# Patient Record
Sex: Male | Born: 1960 | Race: White | Hispanic: No
Health system: Southern US, Community
[De-identification: ages and names within clinical notes are randomized; demographics above are authoritative.]

## PROBLEM LIST (undated history)

## (undated) DIAGNOSIS — F515 Nightmare disorder: Secondary | ICD-10-CM

## (undated) DIAGNOSIS — K648 Other hemorrhoids: Secondary | ICD-10-CM

## (undated) DIAGNOSIS — D649 Anemia, unspecified: Secondary | ICD-10-CM

## (undated) DIAGNOSIS — E349 Endocrine disorder, unspecified: Secondary | ICD-10-CM

## (undated) DIAGNOSIS — M549 Dorsalgia, unspecified: Secondary | ICD-10-CM

## (undated) DIAGNOSIS — F4310 Post-traumatic stress disorder, unspecified: Secondary | ICD-10-CM

## (undated) DIAGNOSIS — W5501XA Bitten by cat, initial encounter: Secondary | ICD-10-CM

## (undated) DIAGNOSIS — E663 Overweight: Secondary | ICD-10-CM

## (undated) DIAGNOSIS — I10 Essential (primary) hypertension: Secondary | ICD-10-CM

## (undated) DIAGNOSIS — T7840XA Allergy, unspecified, initial encounter: Secondary | ICD-10-CM

## (undated) DIAGNOSIS — N3281 Overactive bladder: Secondary | ICD-10-CM

## (undated) DIAGNOSIS — G510 Bell's palsy: Secondary | ICD-10-CM

## (undated) DIAGNOSIS — B001 Herpesviral vesicular dermatitis: Secondary | ICD-10-CM

## (undated) DIAGNOSIS — C439 Malignant melanoma of skin, unspecified: Secondary | ICD-10-CM

## (undated) DIAGNOSIS — F4312 Post-traumatic stress disorder, chronic: Secondary | ICD-10-CM

## (undated) DIAGNOSIS — S01459A Open bite of unspecified cheek and temporomandibular area, initial encounter: Secondary | ICD-10-CM

## (undated) DIAGNOSIS — E039 Hypothyroidism, unspecified: Secondary | ICD-10-CM

## (undated) DIAGNOSIS — E785 Hyperlipidemia, unspecified: Secondary | ICD-10-CM

## (undated) HISTORY — DX: Anemia, unspecified: D64.9

## (undated) HISTORY — DX: Post-traumatic stress disorder, chronic: F43.12

## (undated) HISTORY — DX: Dorsalgia, unspecified: M54.9

## (undated) HISTORY — PX: COLONOSCOPY W/ POLYPECTOMY: SHX1380

## (undated) HISTORY — DX: Endocrine disorder, unspecified: E34.9

## (undated) HISTORY — PX: LIPOMA EXCISION: SHX5283

## (undated) HISTORY — DX: Hypothyroidism, unspecified: E03.9

## (undated) HISTORY — PX: HEMORRHOID SURGERY: SHX153

## (undated) HISTORY — DX: Nightmare disorder: F51.5

## (undated) HISTORY — DX: Overweight: E66.3

## (undated) HISTORY — PX: MELANOMA EXCISION: SHX5266

## (undated) HISTORY — PX: TONSILLECTOMY AND ADENOIDECTOMY: SHX28

## (undated) HISTORY — PX: SKIN LESION EXCISION: SHX2412

## (undated) HISTORY — DX: Malignant melanoma of skin, unspecified: C43.9

## (undated) HISTORY — DX: Overactive bladder: N32.81

## (undated) HISTORY — DX: Herpesviral vesicular dermatitis: B00.1

## (undated) HISTORY — DX: Hyperlipidemia, unspecified: E78.5

## (undated) HISTORY — DX: Post-traumatic stress disorder, unspecified: F43.10

## (undated) HISTORY — DX: Bitten by cat, initial encounter: W55.01XA

## (undated) HISTORY — DX: Other hemorrhoids: K64.8

## (undated) HISTORY — DX: Allergy, unspecified, initial encounter: T78.40XA

## (undated) HISTORY — DX: Essential (primary) hypertension: I10

## (undated) HISTORY — DX: Open bite of unspecified cheek and temporomandibular area, initial encounter: S01.459A

## (undated) HISTORY — PX: CERVICAL SPINE SURGERY: SHX589

## (undated) HISTORY — DX: Bell's palsy: G51.0

---

## 2009-01-24 ENCOUNTER — Observation Stay (HOSPITAL_COMMUNITY): Admission: RE | Admit: 2009-01-24 | Discharge: 2009-01-25 | Payer: Self-pay | Admitting: Neurosurgery

## 2009-03-28 LAB — HM COLONOSCOPY

## 2010-03-30 IMAGING — RF DG CERVICAL SPINE 1V
1 series · 1 of 1 positions shown · non-contrast
Comparison: None available.

CLINICAL DATA: 47-year-old male undergoing cervical surgery.

CERVICAL SPINE - 1 VIEW

[Series 1: run · 1 of 1 slices shown]
[im 1/1]
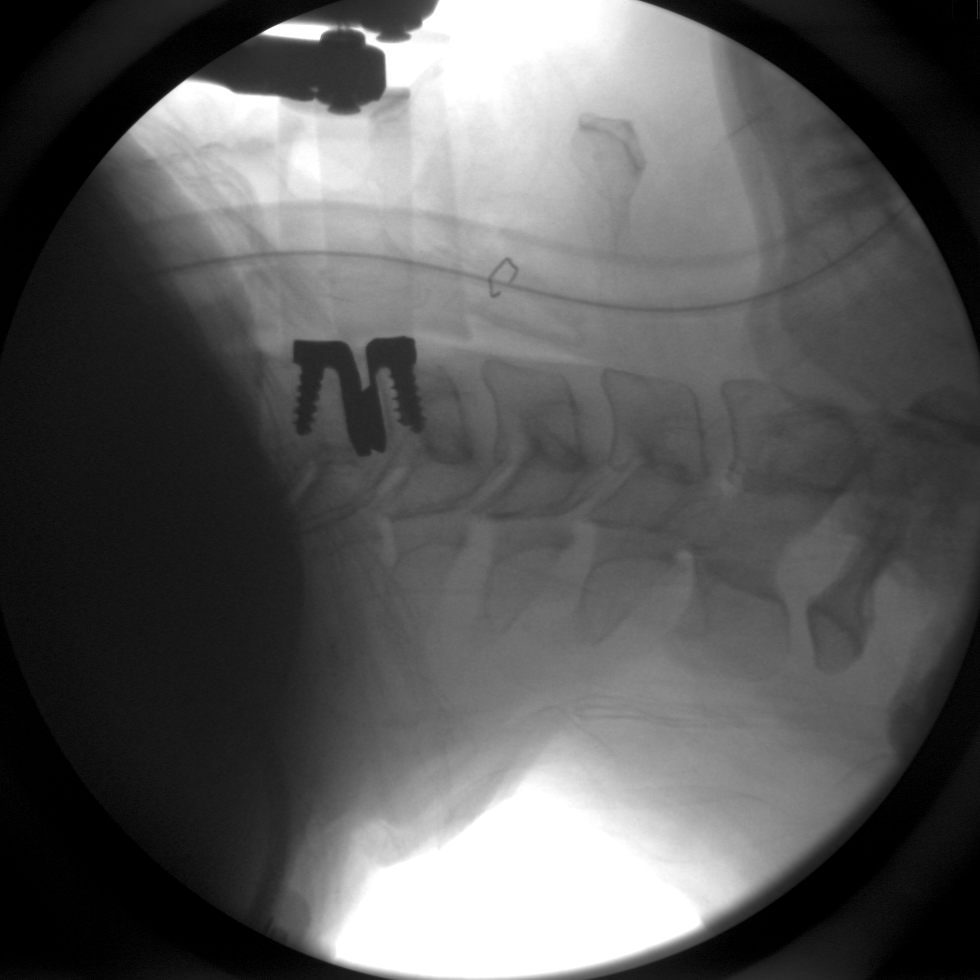

[1 of 1 positions shown; findings below may reference images not displayed]

FINDINGS: Single intraoperative cross-table lateral fluoroscopic
spot view of the cervical spine at 5931 hours.  Disc replacement at
C5-C6 with anchoring hardware at both levels.  No adverse hardware
features identified.  Only the superior aspect of C7 is visible.
IMPRESSION: C5-C6 disc replacement device.  No adverse hardware features
identified.

## 2011-02-07 LAB — URINALYSIS, ROUTINE W REFLEX MICROSCOPIC
Bilirubin Urine: NEGATIVE
Nitrite: NEGATIVE
Specific Gravity, Urine: 1.009 (ref 1.005–1.030)
Urobilinogen, UA: 0.2 mg/dL (ref 0.0–1.0)
pH: 7 (ref 5.0–8.0)

## 2011-02-07 LAB — CBC
HCT: 42.5 % (ref 39.0–52.0)
Hemoglobin: 14.6 g/dL (ref 13.0–17.0)
MCHC: 34.3 g/dL (ref 30.0–36.0)
MCV: 100.2 fL — ABNORMAL HIGH (ref 78.0–100.0)
RBC: 4.24 MIL/uL (ref 4.22–5.81)
WBC: 5.1 10*3/uL (ref 4.0–10.5)

## 2011-02-07 LAB — DIFFERENTIAL
Basophils Absolute: 0 10*3/uL (ref 0.0–0.1)
Eosinophils Absolute: 0.1 10*3/uL (ref 0.0–0.7)
Eosinophils Relative: 2 % (ref 0–5)
Lymphocytes Relative: 25 % (ref 12–46)
Neutrophils Relative %: 67 % (ref 43–77)

## 2011-02-07 LAB — COMPREHENSIVE METABOLIC PANEL
AST: 37 U/L (ref 0–37)
BUN: 7 mg/dL (ref 6–23)
CO2: 29 mEq/L (ref 19–32)
Chloride: 105 mEq/L (ref 96–112)
Creatinine, Ser: 0.8 mg/dL (ref 0.4–1.5)
GFR calc Af Amer: >60 mL/min (ref >60)
GFR calc non Af Amer: >60 mL/min (ref >60)
Glucose, Bld: 81 mg/dL (ref 70–99)
Total Bilirubin: 0.6 mg/dL (ref 0.3–1.2)

## 2011-02-07 LAB — APTT: aPTT: 27 seconds (ref 24–37)

## 2011-03-12 NOTE — Op Note (Signed)
NAME:  Alan Yu, Alan Yu                  ACCOUNT NO.:  [id number]   MEDICAL RECORD NO.:  [id number]          PATIENT TYPE:  INP   LOCATION:  3004                         FACILITY:  [facility]   PHYSICIAN:  Payton Doughty, M.D.      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   OPERATIVE PROCEDURE:  C5-6 cervical disk arthroplasty.   SURGEON:  Payton Doughty, MD, Neurosurgery   ANESTHESIA:  General endotracheal.   PREPARATION:  Prepped and draped with alcohol wipe.   COMPLICATIONS:  None.   NURSE ASSISTANT:  Bedelia Person, MD   DOCTOR ASSISTANT:  Coletta Memos, MD   BODY OF TEXT:  This is a 50 year old gentleman with cervical spondylosis  C5-6 taken to operative suite, anesthetized and intubated, placed supine  on the operating table.  Following shave, prep and drape in usual  sterile fashion, skin was incised in midline and there were  sternocleidomastoid muscle on left side of the platysma was identified,  elevated, and divided, undermined sternocleidomastoids identified.  Medial dissection revealed the carotid artery, retracted laterally to  the left.  Trachea and esophagus retracted laterally to the right  exposing the bones in the anterior cervical spine.  Marker was placed.  Intraoperative x-ray obtained to confirm correctness level and  diskectomy was carried out under gross observation.  Distraction pins  were placed in the bone.  The operating microscope was brought in and we  used microdissection technique to remove the remaining disk and  aggressively decompressed the lateral recesses out over both the C6  roots.  Following complete decompression, we came to the conclusion this  to have 6- x 16-mm artificial disk should be placed.  This was done  under fluoroscopic guidance.  After it was placed and screws were  tightened and flexion/extension, the patient was taken under real time  fluoro and good motion was seen.  Final screws were tightened.  Successive layers of 3-0 Vicryl and 4-0 Vicryl were used to close.  Benzoin and Steri-Strips were placed, made occlusive with Telfa and  OpSite.  The patient returned to recovery room in good condition.           ______________________________  Payton Doughty, M.D.    MWR/MEDQ  D:  01/24/2009  T:  01/24/2009  Job:  [id number]
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
## 2012-07-17 ENCOUNTER — Encounter: Payer: Self-pay | Admitting: Family Medicine

## 2012-07-17 ENCOUNTER — Ambulatory Visit (INDEPENDENT_AMBULATORY_CARE_PROVIDER_SITE_OTHER): Payer: PRIVATE HEALTH INSURANCE | Admitting: Family Medicine

## 2012-07-17 VITALS — BP 110/74 | HR 87 | Temp 97.3°F | Resp 16 | Ht 68.0 in | Wt 212.0 lb

## 2012-07-17 DIAGNOSIS — F515 Nightmare disorder: Secondary | ICD-10-CM

## 2012-07-17 DIAGNOSIS — D649 Anemia, unspecified: Secondary | ICD-10-CM | POA: Insufficient documentation

## 2012-07-17 DIAGNOSIS — K219 Gastro-esophageal reflux disease without esophagitis: Secondary | ICD-10-CM

## 2012-07-17 DIAGNOSIS — K648 Other hemorrhoids: Secondary | ICD-10-CM | POA: Insufficient documentation

## 2012-07-17 DIAGNOSIS — N3281 Overactive bladder: Secondary | ICD-10-CM | POA: Insufficient documentation

## 2012-07-17 DIAGNOSIS — B001 Herpesviral vesicular dermatitis: Secondary | ICD-10-CM | POA: Insufficient documentation

## 2012-07-17 DIAGNOSIS — B009 Herpesviral infection, unspecified: Secondary | ICD-10-CM

## 2012-07-17 DIAGNOSIS — G518 Other disorders of facial nerve: Secondary | ICD-10-CM

## 2012-07-17 DIAGNOSIS — C439 Malignant melanoma of skin, unspecified: Secondary | ICD-10-CM | POA: Insufficient documentation

## 2012-07-17 DIAGNOSIS — F4312 Post-traumatic stress disorder, chronic: Secondary | ICD-10-CM | POA: Insufficient documentation

## 2012-07-17 DIAGNOSIS — IMO0001 Reserved for inherently not codable concepts without codable children: Secondary | ICD-10-CM

## 2012-07-17 DIAGNOSIS — I10 Essential (primary) hypertension: Secondary | ICD-10-CM

## 2012-07-17 DIAGNOSIS — M549 Dorsalgia, unspecified: Secondary | ICD-10-CM

## 2012-07-17 DIAGNOSIS — E785 Hyperlipidemia, unspecified: Secondary | ICD-10-CM

## 2012-07-17 DIAGNOSIS — G510 Bell's palsy: Secondary | ICD-10-CM

## 2012-07-17 DIAGNOSIS — IMO0002 Reserved for concepts with insufficient information to code with codable children: Secondary | ICD-10-CM

## 2012-07-17 DIAGNOSIS — T7840XA Allergy, unspecified, initial encounter: Secondary | ICD-10-CM

## 2012-07-17 DIAGNOSIS — E663 Overweight: Secondary | ICD-10-CM | POA: Insufficient documentation

## 2012-07-17 DIAGNOSIS — Z23 Encounter for immunization: Secondary | ICD-10-CM

## 2012-07-17 DIAGNOSIS — E039 Hypothyroidism, unspecified: Secondary | ICD-10-CM

## 2012-07-17 DIAGNOSIS — E349 Endocrine disorder, unspecified: Secondary | ICD-10-CM

## 2012-07-17 DIAGNOSIS — E291 Testicular hypofunction: Secondary | ICD-10-CM

## 2012-07-17 DIAGNOSIS — F4310 Post-traumatic stress disorder, unspecified: Secondary | ICD-10-CM | POA: Insufficient documentation

## 2012-07-17 MED ORDER — PRAZOSIN HCL 5 MG PO CAPS: ORAL_CAPSULE | ORAL | Status: DC

## 2012-07-17 MED ORDER — CETIRIZINE HCL 10 MG PO TABS: 10.0000 mg | ORAL_TABLET | Freq: Every day | ORAL | Status: AC | PRN

## 2012-07-17 MED ORDER — GABAPENTIN 600 MG PO TABS: ORAL_TABLET | ORAL | Status: DC

## 2012-07-17 MED ORDER — OMEPRAZOLE 20 MG PO CPDR: 20.0000 mg | DELAYED_RELEASE_CAPSULE | Freq: Every day | ORAL | Status: DC

## 2012-07-17 NOTE — Patient Instructions (Addendum)
Preventive Care for Adults, Male A healthy lifestyle and preventative care can promote health and wellness. Preventative health guidelines for men include the following key practices:  A routine yearly physical is a good way to check with your caregiver about your health and preventative screening. It is a chance to share any concerns and updates on your health, and to receive a thorough exam.   Visit your dentist for a routine exam and preventative care every 6 months. Brush your teeth twice a day and floss once a day. Good oral hygiene prevents tooth decay and gum disease.   The frequency of eye exams is based on your age, health, family medical history, use of contact lenses, and other factors. Follow your caregiver's recommendations for frequency of eye exams.   Eat a healthy diet. Foods like vegetables, fruits, whole grains, low-fat dairy products, and lean protein foods contain the nutrients you need without too many calories. Decrease your intake of foods high in solid fats, added sugars, and salt. Eat the right amount of calories for you.Get information about a proper diet from your caregiver, if necessary.   Regular physical exercise is one of the most important things you can do for your health. Most adults should get at least 150 minutes of moderate-intensity exercise (any activity that increases your heart rate and causes you to sweat) each week. In addition, most adults need muscle-strengthening exercises on 2 or more days a week.   Maintain a healthy weight. The body mass index (BMI) is a screening tool to identify possible weight problems. It provides an estimate of body fat based on height and weight. Your caregiver can help determine your BMI, and can help you achieve or maintain a healthy weight.For adults 20 years and older:   A BMI below 18.5 is considered underweight.   A BMI of 18.5 to 24.9 is normal.   A BMI of 25 to 29.9 is considered overweight.   A BMI of 30 and above  is considered obese.   Maintain normal blood lipids and cholesterol levels by exercising and minimizing your intake of saturated fat. Eat a balanced diet with plenty of fruit and vegetables. Blood tests for lipids and cholesterol should begin at age 20 and be repeated every 5 years. If your lipid or cholesterol levels are high, you are over 50, or you are a high risk for heart disease, you may need your cholesterol levels checked more frequently.Ongoing high lipid and cholesterol levels should be treated with medicines if diet and exercise are not effective.   If you smoke, find out from your caregiver how to quit. If you do not use tobacco, do not start.   If you choose to drink alcohol, do not exceed 2 drinks per day. One drink is considered to be 12 ounces (355 mL) of beer, 5 ounces (148 mL) of wine, or 1.5 ounces (44 mL) of liquor.   Avoid use of street drugs. Do not share needles with anyone. Ask for help if you need support or instructions about stopping the use of drugs.   High blood pressure causes heart disease and increases the risk of stroke. Your blood pressure should be checked at least every 1 to 2 years. Ongoing high blood pressure should be treated with medicines, if weight loss and exercise are not effective.   If you are 45 to 53 years old, ask your caregiver if you should take aspirin to prevent heart disease.   Diabetes screening involves taking a blood   sample to check your fasting blood sugar level. This should be done once every 3 years, after age 45, if you are within normal weight and without risk factors for diabetes. Testing should be considered at a younger age or be carried out more frequently if you are overweight and have at least 1 risk factor for diabetes.   Colorectal cancer can be detected and often prevented. Most routine colorectal cancer screening begins at the age of 50 and continues through age 75. However, your caregiver may recommend screening at an earlier  age if you have risk factors for colon cancer. On a yearly basis, your caregiver may provide home test kits to check for hidden blood in the stool. Use of a small camera at the end of a tube, to directly examine the colon (sigmoidoscopy or colonoscopy), can detect the earliest forms of colorectal cancer. Talk to your caregiver about this at age 50, when routine screening begins. Direct examination of the colon should be repeated every 5 to 10 years through age 75, unless early forms of pre-cancerous polyps or small growths are found.   Hepatitis C blood testing is recommended for all people born from 1945 through 1965 and any individual with known risks for hepatitis C.   Practice safe sex. Use condoms and avoid high-risk sexual practices to reduce the spread of sexually transmitted infections (STIs). STIs include gonorrhea, chlamydia, syphilis, trichomonas, herpes, HPV, and human immunodeficiency virus (HIV). Herpes, HIV, and HPV are viral illnesses that have no cure. They can result in disability, cancer, and death.   A one-time screening for abdominal aortic aneurysm (AAA) and surgical repair of large AAAs by sound wave imaging (ultrasonography) is recommended for ages 65 to 75 years who are current or former smokers.   Healthy men should no longer receive prostate-specific antigen (PSA) blood tests as part of routine cancer screening. Consult with your caregiver about prostate cancer screening.   Testicular cancer screening is not recommended for adult males who have no symptoms. Screening includes self-exam, caregiver exam, and other screening tests. Consult with your caregiver about any symptoms you have or any concerns you have about testicular cancer.   Use sunscreen with skin protection factor (SPF) of 30 or more. Apply sunscreen liberally and repeatedly throughout the day. You should seek shade when your shadow is shorter than you. Protect yourself by wearing long sleeves, pants, a  wide-brimmed hat, and sunglasses year round, whenever you are outdoors.   Once a month, do a whole body skin exam, using a mirror to look at the skin on your back. Notify your caregiver of new moles, moles that have irregular borders, moles that are larger than a pencil eraser, or moles that have changed in shape or color.   Stay current with required immunizations.   Influenza. You need a dose every fall (or winter). The composition of the flu vaccine changes each year, so being vaccinated once is not enough.   Pneumococcal polysaccharide. You need 1 to 2 doses if you smoke cigarettes or if you have certain chronic medical conditions. You need 1 dose at age 65 (or older) if you have never been vaccinated.   Tetanus, diphtheria, pertussis (Tdap, Td). Get 1 dose of Tdap vaccine if you are younger than age 65 years, are over 65 and have contact with an infant, are a healthcare worker, or simply want to be protected from whooping cough. After that, you need a Td booster dose every 10 years. Consult your caregiver if   you have not had at least 3 tetanus and diphtheria-containing shots sometime in your life or have a deep or dirty wound.   HPV. This vaccine is recommended for males 13 through 53 years of age. This vaccine may be given to men 22 through 53 years of age who have not completed the 3 dose series. It is recommended for men through age 26 who have sex with men or whose immune system is weakened because of HIV infection, other illness, or medications. The vaccine is given in 3 doses over 6 months.   Measles, mumps, rubella (MMR). You need at least 1 dose of MMR if you were born in 1957 or later. You may also need a 2nd dose.   Meningococcal. If you are age 19 to 21 years and a first-year college student living in a residence hall, or have one of several medical conditions, you need to get vaccinated against meningococcal disease. You may also need additional booster doses.   Zoster (shingles).  If you are age 60 years or older, you should get this vaccine.   Varicella (chickenpox). If you have never had chickenpox or you were vaccinated but received only 1 dose, talk to your caregiver to find out if you need this vaccine.   Hepatitis A. You need this vaccine if you have a specific risk factor for hepatitis A virus infection, or you simply wish to be protected from this disease. The vaccine is usually given as 2 doses, 6 to 18 months apart.   Hepatitis B. You need this vaccine if you have a specific risk factor for hepatitis B virus infection or you simply wish to be protected from this disease. The vaccine is given in 3 doses, usually over 6 months.  Preventative Service / Frequency Ages 19 to 39  Blood pressure check.** / Every 1 to 2 years.   Lipid and cholesterol check.** / Every 5 years beginning at age 20.   Hepatitis C blood test.** / For any individual with known risks for hepatitis C.   Skin self-exam. / Monthly.   Influenza immunization.** / Every year.   Pneumococcal polysaccharide immunization.** / 1 to 2 doses if you smoke cigarettes or if you have certain chronic medical conditions.   Tetanus, diphtheria, pertussis (Tdap,Td) immunization. / A one-time dose of Tdap vaccine. After that, you need a Td booster dose every 10 years.   HPV immunization. / 3 doses over 6 months, if 26 and younger.   Measles, mumps, rubella (MMR) immunization. / You need at least 1 dose of MMR if you were born in 1957 or later. You may also need a 2nd dose.   Meningococcal immunization. / 1 dose if you are age 19 to 21 years and a first-year college student living in a residence hall, or have one of several medical conditions, you need to get vaccinated against meningococcal disease. You may also need additional booster doses.   Varicella immunization.** / Consult your caregiver.   Hepatitis A immunization.** / Consult your caregiver. 2 doses, 6 to 18 months apart.   Hepatitis B  immunization.** / Consult your caregiver. 3 doses usually over 6 months.  Ages 40 to 64  Blood pressure check.** / Every 1 to 2 years.   Lipid and cholesterol check.** / Every 5 years beginning at age 20.   Fecal occult blood test (FOBT) of stool. / Every year beginning at age 50 and continuing until age 75. You may not have to do this test if   you get colonoscopy every 10 years.   Flexible sigmoidoscopy** or colonoscopy.** / Every 5 years for a flexible sigmoidoscopy or every 10 years for a colonoscopy beginning at age 16 and continuing until age 35.   Hepatitis C blood test.** / For all people born from [birth year] through 1965 and any individual with known risks for hepatitis C.   Skin self-exam. / Monthly.   Influenza immunization.** / Every year.   Pneumococcal polysaccharide immunization.** / 1 to 2 doses if you smoke cigarettes or if you have certain chronic medical conditions.   Tetanus, diphtheria, pertussis (Tdap/Td) immunization.** / A one-time dose of Tdap vaccine. After that, you need a Td booster dose every 10 years.   Measles, mumps, rubella (MMR) immunization. / You need at least 1 dose of MMR if you were born in 1957 or later. You may also need a 2nd dose.   Varicella immunization.**/ Consult your caregiver.   Meningococcal immunization.** / Consult your caregiver.   Hepatitis A immunization.** / Consult your caregiver. 2 doses, 6 to 18 months apart.   Hepatitis B immunization.** / Consult your caregiver. 3 doses, usually over 6 months.  Ages 32 and over  Blood pressure check.** / Every 1 to 2 years.   Lipid and cholesterol check.**/ Every 5 years beginning at age 56.   Fecal occult blood test (FOBT) of stool. / Every year beginning at age 47 and continuing until age 68. You may not have to do this test if you get colonoscopy every 10 years.   Flexible sigmoidoscopy** or colonoscopy.** / Every 5 years for a flexible sigmoidoscopy or every 10 years for a colonoscopy  beginning at age 12 and continuing until age 53.   Hepatitis C blood test.** / For all people born from [birth year] through 1965 and any individual with known risks for hepatitis C.   Abdominal aortic aneurysm (AAA) screening.** / A one-time screening for ages 41 to 9 years who are current or former smokers.   Skin self-exam. / Monthly.   Influenza immunization.** / Every year.   Pneumococcal polysaccharide immunization.** / 1 dose at age 43 (or older) if you have never been vaccinated.   Tetanus, diphtheria, pertussis (Tdap, Td) immunization. / A one-time dose of Tdap vaccine if you are over 65 and have contact with an infant, are a Research scientist (physical sciences), or simply want to be protected from whooping cough. After that, you need a Td booster dose every 10 years.   Varicella immunization. ** / Consult your caregiver.   Meningococcal immunization.** / Consult your caregiver.   Hepatitis A immunization. ** / Consult your caregiver. 2 doses, 6 to 18 months apart.   Hepatitis B immunization.** / Check with your caregiver. 3 doses, usually over 6 months.  **Family history and personal history of risk and conditions may change your caregiver's recommendations. Document Released: 12/10/2001 Document Revised: 10/03/2011 Document Reviewed: 03/11/2011 Exit[employer] Patient Information 2012 Exit[employer], [place].  [employer] [facility] by [employer]

## 2012-07-19 ENCOUNTER — Encounter: Payer: Self-pay | Admitting: Family Medicine

## 2012-07-19 NOTE — Assessment & Plan Note (Addendum)
Will check sleep study, consider DASH diet

## 2012-07-19 NOTE — Assessment & Plan Note (Signed)
Avoid trans fats and simple carbs, start MegaRed caps daily

## 2012-07-19 NOTE — Assessment & Plan Note (Signed)
Will finish his last 2 week sof Androgel and then we will check levels and try switching him to shots every other week for cost considerations.

## 2012-07-19 NOTE — Assessment & Plan Note (Signed)
Encouraged hi fiber diet with probiotics and good hydration

## 2012-07-19 NOTE — Assessment & Plan Note (Signed)
Suppressed on acyclovir
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
## 2012-07-19 NOTE — Assessment & Plan Note (Signed)
Prazosin helpful

## 2012-07-19 NOTE — Assessment & Plan Note (Signed)
Taking vitamin d and calcium, will check levels

## 2012-07-19 NOTE — Assessment & Plan Note (Signed)
Follows closely with  dermatology

## 2012-07-19 NOTE — Assessment & Plan Note (Signed)
With increased fatigue and recent weight gain. Check a sleep study, increase exercise

## 2012-07-19 NOTE — Assessment & Plan Note (Signed)
Neck has a medtronic cervical implant. Symptoms manageable

## 2012-07-19 NOTE — Assessment & Plan Note (Signed)
Permanent on left

## 2012-07-19 NOTE — Assessment & Plan Note (Addendum)
Served in the [name] and has had to work very hard to find the right combination of medications to suppress his bad night mares that caused to be violent at times. He is on Prazosin, Cymbalta and Neurontin

## 2012-07-19 NOTE — Assessment & Plan Note (Addendum)
No change in meds today

## 2012-07-22 ENCOUNTER — Other Ambulatory Visit: Payer: Self-pay

## 2012-07-22 MED ORDER — TESTOSTERONE CYPIONATE 200 MG/ML IM SOLN: 200.0000 mg | INTRAMUSCULAR | Status: DC

## 2012-07-22 NOTE — Telephone Encounter (Signed)
Pt left a message stating that MD had discussed for pt to start taking the testosterone shot? Pt would like to know if MD is going to send an RX to pharmacy? Please advise?

## 2012-07-22 NOTE — Telephone Encounter (Signed)
Go ahead and send the Testosterone 200/ml dose prescription in, sig: 1 ml SQ every other week, disp #1 botte, 1 rf. Also please notify I have discovered he got the Td the day he was here instead of the Tdap. Let him know that when he comes in for the testosterone we will give him the Tdap w/o charge if that is OK with him

## 2012-07-22 NOTE — Telephone Encounter (Signed)
I will send RX to [facility] per pts request. Pt is going to come in to the office on 07-28-12 to do testosterone level while still on Androgel per Dr Abner Greenspan.  I also informed the patient on the mishap with the TD. Pt voiced understanding but also stated to me that he questioned the medical assistant when she came into the room after she stated she was giving him his tetanus with diptheria. Pt stated that he asked MA are you sure that is what he was suppressed to have? Pt stated that MA said "yes" this is what MD marked on the paperwork. I apologized several times and informed him after he asked me if Dr Abner Greenspan just marked the wrong item, that no TDAP was marked. Pt stated he would like to get the free TDAP when he comes in for his testosterone injection.

## 2012-07-27 ENCOUNTER — Other Ambulatory Visit (INDEPENDENT_AMBULATORY_CARE_PROVIDER_SITE_OTHER): Payer: PRIVATE HEALTH INSURANCE

## 2012-07-27 DIAGNOSIS — E291 Testicular hypofunction: Secondary | ICD-10-CM

## 2012-07-28 ENCOUNTER — Other Ambulatory Visit: Payer: PRIVATE HEALTH INSURANCE | Admitting: Family Medicine

## 2012-07-28 LAB — TESTOSTERONE: Testosterone: 351.01 ng/dL (ref 300–890)

## 2012-07-30 ENCOUNTER — Ambulatory Visit (INDEPENDENT_AMBULATORY_CARE_PROVIDER_SITE_OTHER): Payer: PRIVATE HEALTH INSURANCE | Admitting: *Deleted

## 2012-07-30 DIAGNOSIS — E349 Endocrine disorder, unspecified: Secondary | ICD-10-CM

## 2012-07-30 DIAGNOSIS — E291 Testicular hypofunction: Secondary | ICD-10-CM

## 2012-07-30 MED ORDER — TESTOSTERONE CYPIONATE 200 MG/ML IM SOLN
200.0000 mg | INTRAMUSCULAR | Status: AC
  Administered 2012-07-30: 200 mg via INTRAMUSCULAR

## 2012-07-30 NOTE — Progress Notes (Signed)
Pt presented for first testosterone injection.  This is tolerated well in left gluteal.  Daughter, who is nursing student, taught injection technique and will give injections at home.  Pt will call with problems or questions.

## 2012-08-04 ENCOUNTER — Telehealth: Payer: Self-pay

## 2012-08-04 ENCOUNTER — Encounter: Payer: Self-pay | Admitting: Family Medicine

## 2012-08-04 MED ORDER — "SYRINGE 22G X 1-1/2"" 3 ML MISC": Status: DC

## 2012-08-04 NOTE — Telephone Encounter (Signed)
I sent him a long message in mychart explaining everything and apologizing, let me know if he calls back

## 2012-08-04 NOTE — Telephone Encounter (Signed)
Patient called in asking about testosterone results and had asked Judeth Cornfield last Thursday to send in syringes to [facility] so his daughter can give him the testosterone injection.   I left a message on patients voicemail stating the testosterone numbers and will send an RX to [facility] for syringes and needles

## 2012-08-04 NOTE — Telephone Encounter (Signed)
Pt sent back a mychart message. Forwarded to MD

## 2012-08-04 NOTE — Telephone Encounter (Signed)
FYI

## 2012-08-04 NOTE — Telephone Encounter (Signed)
Patient called and is wandering why the Free Testosterone wasn't done? Please resend to [facility].

## 2012-09-16 ENCOUNTER — Other Ambulatory Visit (INDEPENDENT_AMBULATORY_CARE_PROVIDER_SITE_OTHER): Payer: PRIVATE HEALTH INSURANCE

## 2012-09-16 DIAGNOSIS — E785 Hyperlipidemia, unspecified: Secondary | ICD-10-CM

## 2012-09-16 DIAGNOSIS — I10 Essential (primary) hypertension: Secondary | ICD-10-CM

## 2012-09-16 DIAGNOSIS — E291 Testicular hypofunction: Secondary | ICD-10-CM

## 2012-09-16 LAB — HEPATIC FUNCTION PANEL
AST: 24 U/L (ref 0–37)
Albumin: 3.6 g/dL (ref 3.5–5.2)
Total Bilirubin: 0.7 mg/dL (ref 0.3–1.2)

## 2012-09-16 LAB — RENAL FUNCTION PANEL
Albumin: 3.6 g/dL (ref 3.5–5.2)
BUN: 10 mg/dL (ref 6–23)
Calcium: 9 mg/dL (ref 8.4–10.5)
Glucose, Bld: 90 mg/dL (ref 70–99)
Potassium: 3.6 mEq/L (ref 3.5–5.1)

## 2012-09-16 LAB — CBC
HCT: 42 % (ref 39.0–52.0)
MCHC: 33.4 g/dL (ref 30.0–36.0)
MCV: 101.2 fl — ABNORMAL HIGH (ref 78.0–100.0)
Platelets: 186 10*3/uL (ref 150.0–400.0)
RBC: 4.15 Mil/uL — ABNORMAL LOW (ref 4.22–5.81)

## 2012-09-16 LAB — LIPID PANEL
Cholesterol: 154 mg/dL (ref 0–200)
LDL Cholesterol: 107 mg/dL — ABNORMAL HIGH (ref 0–99)
Triglycerides: 60 mg/dL (ref 0.0–149.0)
VLDL: 12 mg/dL (ref 0.0–40.0)

## 2012-09-16 LAB — MAGNESIUM: Magnesium: 1.9 mg/dL (ref 1.5–2.5)

## 2012-09-17 LAB — VITAMIN D 25 HYDROXY (VIT D DEFICIENCY, FRACTURES): Vit D, 25-Hydroxy: 64 ng/mL (ref 30–89)

## 2012-09-18 LAB — TESTOSTERONE, FREE, TOTAL, SHBG: Testosterone-% Free: 2.1 % (ref 1.6–2.9)

## 2012-09-21 ENCOUNTER — Encounter: Payer: Self-pay | Admitting: Family Medicine

## 2012-09-21 ENCOUNTER — Ambulatory Visit (INDEPENDENT_AMBULATORY_CARE_PROVIDER_SITE_OTHER): Payer: PRIVATE HEALTH INSURANCE | Admitting: Family Medicine

## 2012-09-21 VITALS — BP 129/87 | HR 65 | Temp 97.8°F | Ht 68.0 in | Wt 217.1 lb

## 2012-09-21 DIAGNOSIS — E039 Hypothyroidism, unspecified: Secondary | ICD-10-CM

## 2012-09-21 DIAGNOSIS — S01409A Unspecified open wound of unspecified cheek and temporomandibular area, initial encounter: Secondary | ICD-10-CM

## 2012-09-21 DIAGNOSIS — W5501XA Bitten by cat, initial encounter: Secondary | ICD-10-CM

## 2012-09-21 DIAGNOSIS — Z8639 Personal history of other endocrine, nutritional and metabolic disease: Secondary | ICD-10-CM

## 2012-09-21 DIAGNOSIS — I10 Essential (primary) hypertension: Secondary | ICD-10-CM

## 2012-09-21 DIAGNOSIS — L0390 Cellulitis, unspecified: Secondary | ICD-10-CM

## 2012-09-21 DIAGNOSIS — IMO0001 Reserved for inherently not codable concepts without codable children: Secondary | ICD-10-CM

## 2012-09-21 DIAGNOSIS — E349 Endocrine disorder, unspecified: Secondary | ICD-10-CM

## 2012-09-21 DIAGNOSIS — E785 Hyperlipidemia, unspecified: Secondary | ICD-10-CM

## 2012-09-21 DIAGNOSIS — D649 Anemia, unspecified: Secondary | ICD-10-CM

## 2012-09-21 DIAGNOSIS — Z862 Personal history of diseases of the blood and blood-forming organs and certain disorders involving the immune mechanism: Secondary | ICD-10-CM

## 2012-09-21 DIAGNOSIS — S01459A Open bite of unspecified cheek and temporomandibular area, initial encounter: Secondary | ICD-10-CM

## 2012-09-21 DIAGNOSIS — E291 Testicular hypofunction: Secondary | ICD-10-CM

## 2012-09-21 DIAGNOSIS — L0291 Cutaneous abscess, unspecified: Secondary | ICD-10-CM

## 2012-09-21 HISTORY — DX: Bitten by cat, initial encounter: W55.01XA

## 2012-09-21 HISTORY — DX: Bitten by cat, initial encounter: S01.459A

## 2012-09-21 MED ORDER — AMOXICILLIN-POT CLAVULANATE 875-125 MG PO TABS: 1.0000 | ORAL_TABLET | Freq: Two times a day (BID) | ORAL | Status: DC

## 2012-09-21 MED ORDER — TESTOSTERONE CYPIONATE 200 MG/ML IM SOLN: 100.0000 mg | INTRAMUSCULAR | Status: DC

## 2012-09-21 MED ORDER — LEVOTHYROXINE SODIUM 125 MCG PO TABS: 125.0000 ug | ORAL_TABLET | Freq: Every day | ORAL | Status: AC

## 2012-09-21 NOTE — Assessment & Plan Note (Signed)
Started on Augmentin, call if worsens

## 2012-09-21 NOTE — Assessment & Plan Note (Signed)
Testosterone was checked one week after last dose and was elevated will drop dose to 100 ml every other week and recheck in 3 months. Is reporting feeling much better and would like to continue despite risk
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
## 2012-09-21 NOTE — Assessment & Plan Note (Signed)
Given refill on Synthroid today

## 2012-09-21 NOTE — Assessment & Plan Note (Signed)
Well controlled today no changes to meds 

## 2012-09-21 NOTE — Assessment & Plan Note (Addendum)
Avoid trans fats, increase exercise and add megaRed caps daily

## 2012-09-21 NOTE — Patient Instructions (Addendum)
Switch to Mega Red krill oil caps daily from fish oil Probiotics daily   Cat Scratch Disease Cats often injure people by scratching or biting. This site of injury can become infected with a particular germ or bacteria present in the mouth of or on the cat. This germ is called Bartonella henselae. This infection is identified by the common name cat scratch disease (CSD).  SYMPTOMS  A red and sore pimple or bump, with or without pus, on the skin where the cat scratched or bit. The pimple or sore may be present for as long as three weeks after the scratch or bite occurred.  One or more enlarged lymph glands located toward the center of the body from where the injury occurred.  Less common symptoms include low-grade fever, tiredness, fatigue, headache and/or sore throat. DIAGNOSIS  The diagnosis is typically made by your caregiver who notes the history of a scratch or bite from a cat, and finds the skin sore and swollen lymph glands in the described area.  Culture of any drainage or pus from the injury site, or a needle aspiration or piece of tissue (biopsy) from a swollen lymph gland may also be done to confirm the diagnosis and assure that a different infection or disease is not causing your illness. Rare but serious complications may occur, they include:  Parinaud's syndrome - fever, swollen lymph glands and inflammation of the eye (conjunctivitis).  Infection of the brain (encephalitis).  Infection of the nerve of the eye (neuroretinitis).  Infection of the bone (osteomyelitis). TREATMENT  Usually treatment is not necessary or helpful, especially if you have a normal immune system. When infection is very severe, it may be treated with a medicine that kills the bacteria (antibiotic).  People with immune system problems (such as having AIDS or an organ transplant, or being on steroids or other immune modifying drugs) should be treated with antibiotics. HOME CARE INSTRUCTIONS   Avoid  injury while playing with cats.  Wash well after playing with cats.  Do not let your cat lick sores on your body.  Do not let your cat roam around outside of your house.  Keep the area of the cat scratch clean. Wash it with soap and water or apply an antiseptic solution such as povidone iodine.  You should get a tetanus shot if you have not had one in the past 5 or 10 years. If you receive one, your arm may get swollen and red and warm to the touch at the shot site. This is a common response to the medication in the shot. If you did not receive a tetanus shot here because you did not recall when your last one was given, make sure to check with your caregiver's office and determine if one is needed. Generally, for a "dirty" wound, you should receive a tetanus booster if you have not had one in the last five years. If you have a "clean" wound, you should receive a tetanus booster if you have not had one in the last ten years. SEEK IMMEDIATE MEDICAL CARE IF:   You have worsening signs of infection, such as more redness, increased pain, red streaking or pus coming from the wound, or warmth or swelling around the area of the scratch.  You develop worsening swollen lymph glands.  You develop abdominal pain, have problems with your vision or develop a skin rash.  You have a fever.  You become more tired or dizzy, or have a worsening headache.  You  develop inflammation of your eye or have increasing vision problems.  You have pain in one of your bones.  You develop a stiff neck.  You pass out. MAKE SURE YOU:   Understand these instructions.  Will watch your condition.  Will get help right away if you are not doing well or get worse. Document Released: 10/11/2000 Document Revised: 01/06/2012 Document Reviewed: 11/23/2008 [facility] Patient Information 2013 [place], [place].

## 2012-09-22 ENCOUNTER — Ambulatory Visit: Payer: PRIVATE HEALTH INSURANCE | Admitting: Family Medicine

## 2012-09-23 ENCOUNTER — Ambulatory Visit: Payer: PRIVATE HEALTH INSURANCE | Admitting: Family Medicine

## 2012-09-23 LAB — INTRINSIC FACTOR ANTIBODIES: Intrinsic Factor: NEGATIVE

## 2012-11-17 ENCOUNTER — Other Ambulatory Visit (INDEPENDENT_AMBULATORY_CARE_PROVIDER_SITE_OTHER): Payer: PRIVATE HEALTH INSURANCE

## 2012-11-17 DIAGNOSIS — E785 Hyperlipidemia, unspecified: Secondary | ICD-10-CM

## 2012-11-17 DIAGNOSIS — I10 Essential (primary) hypertension: Secondary | ICD-10-CM

## 2012-11-17 DIAGNOSIS — E291 Testicular hypofunction: Secondary | ICD-10-CM

## 2012-11-17 LAB — RENAL FUNCTION PANEL
Albumin: 3.9 g/dL (ref 3.5–5.2)
BUN: 12 mg/dL (ref 6–23)
Creatinine, Ser: 0.8 mg/dL (ref 0.4–1.5)
Glucose, Bld: 95 mg/dL (ref 70–99)
Phosphorus: 3.7 mg/dL (ref 2.3–4.6)

## 2012-11-17 LAB — CBC
Hemoglobin: 14.5 g/dL (ref 13.0–17.0)
Platelets: 193 10*3/uL (ref 150.0–400.0)
RBC: 4.34 Mil/uL (ref 4.22–5.81)
RDW: 12.7 % (ref 11.5–14.6)
WBC: 5.3 10*3/uL (ref 4.5–10.5)

## 2012-11-17 LAB — HEPATIC FUNCTION PANEL
ALT: 24 U/L (ref 0–53)
AST: 25 U/L (ref 0–37)
Alkaline Phosphatase: 56 U/L (ref 39–117)
Bilirubin, Direct: 0 mg/dL (ref 0.0–0.3)
Total Protein: 6.3 g/dL (ref 6.0–8.3)

## 2012-11-17 LAB — LIPID PANEL
HDL: 34 mg/dL — ABNORMAL LOW (ref >39.00)
Total CHOL/HDL Ratio: 4

## 2012-11-17 NOTE — Progress Notes (Signed)
Labs only

## 2012-11-18 LAB — TESTOSTERONE, FREE, TOTAL, SHBG
Testosterone, Free: 74.6 pg/mL (ref 47.0–244.0)
Testosterone-% Free: 1.5 % — ABNORMAL LOW (ref 1.6–2.9)

## 2012-11-25 ENCOUNTER — Ambulatory Visit (INDEPENDENT_AMBULATORY_CARE_PROVIDER_SITE_OTHER): Payer: PRIVATE HEALTH INSURANCE | Admitting: Family Medicine

## 2012-11-25 ENCOUNTER — Encounter: Payer: Self-pay | Admitting: Family Medicine

## 2012-11-25 VITALS — BP 115/82 | HR 63 | Temp 97.3°F | Ht 68.0 in | Wt 207.8 lb

## 2012-11-25 DIAGNOSIS — Z8639 Personal history of other endocrine, nutritional and metabolic disease: Secondary | ICD-10-CM

## 2012-11-25 DIAGNOSIS — I10 Essential (primary) hypertension: Secondary | ICD-10-CM

## 2012-11-25 DIAGNOSIS — E291 Testicular hypofunction: Secondary | ICD-10-CM

## 2012-11-25 DIAGNOSIS — Z862 Personal history of diseases of the blood and blood-forming organs and certain disorders involving the immune mechanism: Secondary | ICD-10-CM

## 2012-11-25 DIAGNOSIS — E349 Endocrine disorder, unspecified: Secondary | ICD-10-CM

## 2012-11-25 DIAGNOSIS — E785 Hyperlipidemia, unspecified: Secondary | ICD-10-CM

## 2012-11-25 DIAGNOSIS — E039 Hypothyroidism, unspecified: Secondary | ICD-10-CM

## 2012-11-25 MED ORDER — TESTOSTERONE CYPIONATE 200 MG/ML IM SOLN: 150.0000 mg | INTRAMUSCULAR | Status: DC
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
## 2012-11-25 NOTE — Patient Instructions (Addendum)
Testosterone injection What is this medicine? TESTOSTERONE (tes TOS ter one) is the main male hormone. It supports normal male development such as muscle growth, facial hair, and deep voice. It is used in males to treat low testosterone levels. This medicine may be used for other purposes; ask your health care provider or pharmacist if you have questions. What should I tell my health care provider before I take this medicine? They need to know if you have any of these conditions: -breast cancer -diabetes -heart disease -kidney disease -liver disease -lung disease -prostate cancer, enlargement -an unusual or allergic reaction to testosterone, other medicines, foods, dyes, or preservatives -pregnant or trying to get pregnant -breast-feeding How should I use this medicine? This medicine is for injection into a muscle. It is usually given by a health care professional in a hospital or clinic setting. Contact your pediatrician regarding the use of this medicine in children. While this medicine may be prescribed for children as young as 12 years of age for selected conditions, precautions do apply. Overdosage: If you think you have taken too much of this medicine contact a poison control center or emergency room at once. NOTE: This medicine is only for you. Do not share this medicine with others. What if I miss a dose? Try not to miss a dose. Your doctor or health care professional will tell you when your next injection is due. Notify the office if you are unable to keep an appointment. What may interact with this medicine? -medicines for diabetes -medicines that treat or prevent blood clots like warfarin -oxyphenbutazone -propranolol -steroid medicines like prednisone or cortisone This list may not describe all possible interactions. Give your health care provider a list of all the medicines, herbs, non-prescription drugs, or dietary supplements you use. Also tell them if you smoke, drink  alcohol, or use illegal drugs. Some items may interact with your medicine. What should I watch for while using this medicine? Visit your doctor or health care professional for regular checks on your progress. They will need to check the level of testosterone in your blood. This medicine may affect blood sugar levels. If you have diabetes, check with your doctor or health care professional before you change your diet or the dose of your diabetic medicine. This drug is banned from use in athletes by most athletic organizations. What side effects may I notice from receiving this medicine? Side effects that you should report to your doctor or health care professional as soon as possible: -allergic reactions like skin rash, itching or hives, swelling of the face, lips, or tongue -breast enlargement -breathing problems -changes in mood, especially anger, depression, or rage -dark urine -general ill feeling or flu-like symptoms -light-colored stools -loss of appetite, nausea -nausea, vomiting -right upper belly pain -stomach pain -swelling of ankles -too frequent or persistent erections -trouble passing urine or change in the amount of urine -unusually weak or tired -yellowing of the eyes or skin Additional side effects that can occur in women include: -deep or hoarse voice -facial hair growth -irregular menstrual periods Side effects that usually do not require medical attention (report to your doctor or health care professional if they continue or are bothersome): -acne -change in sex drive or performance -hair loss -headache This list may not describe all possible side effects. Call your doctor for medical advice about side effects. You may report side effects to FDA at 1-800-FDA-1088. Where should I keep my medicine? Keep out of the reach of children. This medicine   can be abused. Keep your medicine in a safe place to protect it from theft. Do not share this medicine with anyone.  Selling or giving away this medicine is dangerous and against the law. Store at room temperature between 20 and 25 degrees C (68 and 77 degrees F). Do not freeze. Protect from light. Follow the directions for the product you are prescribed. Throw away any unused medicine after the expiration date. NOTE: This sheet is a summary. It may not cover all possible information. If you have questions about this medicine, talk to your doctor, pharmacist, or health care provider.  2012, [employer]. (12/25/2007 4:13:46 PM) 

## 2012-11-25 NOTE — Assessment & Plan Note (Signed)
patinet with significant symptoms on lower dose of testosterone. Will increase Testosterone dose to 150 mg every other week and check levels in roughly 10 weeks.

## 2012-11-25 NOTE — Assessment & Plan Note (Signed)
Well treated. No changes

## 2012-11-25 NOTE — Assessment & Plan Note (Signed)
Well controlled, no medications 

## 2012-11-25 NOTE — Assessment & Plan Note (Signed)
Mild depression of hdl. Increase exercise. Switch from fish oil to krill oil

## 2012-12-15 ENCOUNTER — Other Ambulatory Visit: Payer: PRIVATE HEALTH INSURANCE

## 2012-12-22 ENCOUNTER — Ambulatory Visit: Payer: PRIVATE HEALTH INSURANCE | Admitting: Family Medicine

## 2013-01-04 ENCOUNTER — Telehealth: Payer: Self-pay

## 2013-01-04 DIAGNOSIS — F4310 Post-traumatic stress disorder, unspecified: Secondary | ICD-10-CM

## 2013-01-04 MED ORDER — ACYCLOVIR 400 MG PO TABS: 400.0000 mg | ORAL_TABLET | Freq: Two times a day (BID) | ORAL | Status: AC

## 2013-01-04 MED ORDER — PRAZOSIN HCL 5 MG PO CAPS: ORAL_CAPSULE | ORAL | Status: AC

## 2013-01-04 NOTE — Telephone Encounter (Signed)
Pt called stating he needed 2 RX's sent to [facility]. RX's sent

## 2013-01-06 ENCOUNTER — Other Ambulatory Visit (INDEPENDENT_AMBULATORY_CARE_PROVIDER_SITE_OTHER): Payer: PRIVATE HEALTH INSURANCE

## 2013-01-06 DIAGNOSIS — E291 Testicular hypofunction: Secondary | ICD-10-CM

## 2013-01-06 NOTE — Progress Notes (Signed)
Labs only

## 2013-01-07 LAB — TESTOSTERONE, FREE, TOTAL, SHBG: Testosterone, Free: 64.9 pg/mL (ref 47.0–244.0)

## 2013-01-08 ENCOUNTER — Telehealth: Payer: Self-pay

## 2013-01-08 ENCOUNTER — Other Ambulatory Visit: Payer: Self-pay | Admitting: Family Medicine

## 2013-01-08 NOTE — Telephone Encounter (Signed)
Patient is currently taking .75 ml (150 mg total) q 14 days. Please advise?

## 2013-01-08 NOTE — Telephone Encounter (Signed)
Increase Testosterone to 200 mg each dose and recheck levels in 6-8 weeks

## 2013-01-08 NOTE — Telephone Encounter (Signed)
Pt informed and voiced understanding. Pt will call back to schedule lab appt

## 2013-01-08 NOTE — Telephone Encounter (Signed)
Patient called stating that he would like to have his testosterone increased and doesn't have an appt until July. Please advise?

## 2013-02-06 ENCOUNTER — Other Ambulatory Visit: Payer: Self-pay | Admitting: Family Medicine

## 2013-02-06 DIAGNOSIS — F4310 Post-traumatic stress disorder, unspecified: Secondary | ICD-10-CM

## 2013-02-08 ENCOUNTER — Other Ambulatory Visit: Payer: Self-pay

## 2013-02-08 MED ORDER — GABAPENTIN 600 MG PO TABS: ORAL_TABLET | ORAL | Status: AC

## 2013-05-14 ENCOUNTER — Other Ambulatory Visit: Payer: PRIVATE HEALTH INSURANCE

## 2013-05-21 ENCOUNTER — Encounter: Payer: PRIVATE HEALTH INSURANCE | Admitting: Family Medicine

## 2013-09-02 ENCOUNTER — Other Ambulatory Visit: Payer: Self-pay

## 2015-01-06 ENCOUNTER — Encounter: Payer: Self-pay | Admitting: Physician Assistant

## 2015-01-06 ENCOUNTER — Ambulatory Visit (INDEPENDENT_AMBULATORY_CARE_PROVIDER_SITE_OTHER): Payer: BLUE CROSS/BLUE SHIELD | Admitting: Physician Assistant

## 2015-01-06 VITALS — BP 117/68 | HR 80 | Temp 98.4°F | Resp 16 | Ht 68.0 in | Wt 223.0 lb

## 2015-01-06 DIAGNOSIS — L309 Dermatitis, unspecified: Secondary | ICD-10-CM | POA: Insufficient documentation

## 2015-01-06 MED ORDER — METHYLPREDNISOLONE (PAK) 4 MG PO TABS: ORAL_TABLET | ORAL | Status: AC

## 2015-01-06 MED ORDER — HYDROXYZINE HCL 10 MG PO TABS: 10.0000 mg | ORAL_TABLET | Freq: Three times a day (TID) | ORAL | Status: AC | PRN

## 2015-01-06 NOTE — Patient Instructions (Signed)
Please take steroid as directed.  Continue cream.  Use Atarax as directed for itch.  Cool compresses or OTC Sarna lotion may also be beneficial.  Call or return to clinic if symptoms are not improving.
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
## 2015-01-06 NOTE — Progress Notes (Signed)
Pre visit review using our clinic review tool, if applicable. No additional management support is needed unless otherwise documented below in the visit note/SLS  

## 2015-01-08 NOTE — Assessment & Plan Note (Signed)
Unclear etiology.  Suspect allergic in nature.  Not responding to patient's old Clobetasol RX.  Will given Medrol dose pack.  Rx Atarax for itch. Cool compresses and sarna lotion.  Return precautions discussed with patient.

## 2015-01-09 ENCOUNTER — Ambulatory Visit: Payer: PRIVATE HEALTH INSURANCE | Admitting: Physician Assistant

## 2016-09-23 MED FILL — AMOXICILLIN 500 MG CAPSULE: 500 | 9 days supply | Qty: 25 | Fill #0

## 2016-10-08 MED FILL — AMOXICILLIN 500 MG CAPSULE: 500 | 9 days supply | Qty: 25 | Fill #0

## 2016-11-18 DIAGNOSIS — K8000 Calculus of gallbladder with acute cholecystitis without obstruction: Secondary | ICD-10-CM | POA: Diagnosis not present

## 2016-11-18 DIAGNOSIS — R1010 Upper abdominal pain, unspecified: Secondary | ICD-10-CM | POA: Diagnosis not present

## 2016-11-18 DIAGNOSIS — R935 Abnormal findings on diagnostic imaging of other abdominal regions, including retroperitoneum: Secondary | ICD-10-CM | POA: Diagnosis not present

## 2016-11-21 DIAGNOSIS — R1013 Epigastric pain: Secondary | ICD-10-CM | POA: Diagnosis not present

## 2016-11-21 DIAGNOSIS — Z0000 Encounter for general adult medical examination without abnormal findings: Secondary | ICD-10-CM | POA: Diagnosis not present

## 2016-11-22 DIAGNOSIS — Z0000 Encounter for general adult medical examination without abnormal findings: Secondary | ICD-10-CM | POA: Diagnosis not present

## 2017-01-09 DIAGNOSIS — H579 Unspecified disorder of eye and adnexa: Secondary | ICD-10-CM | POA: Diagnosis not present

## 2017-01-09 DIAGNOSIS — M25559 Pain in unspecified hip: Secondary | ICD-10-CM | POA: Diagnosis not present

## 2017-01-09 DIAGNOSIS — B356 Tinea cruris: Secondary | ICD-10-CM | POA: Diagnosis not present

## 2017-01-31 DIAGNOSIS — K8020 Calculus of gallbladder without cholecystitis without obstruction: Secondary | ICD-10-CM | POA: Diagnosis not present

## 2017-01-31 DIAGNOSIS — K654 Sclerosing mesenteritis: Secondary | ICD-10-CM | POA: Diagnosis not present

## 2017-02-07 DIAGNOSIS — K8020 Calculus of gallbladder without cholecystitis without obstruction: Secondary | ICD-10-CM | POA: Diagnosis not present

## 2017-02-21 DIAGNOSIS — K8020 Calculus of gallbladder without cholecystitis without obstruction: Secondary | ICD-10-CM | POA: Diagnosis not present

## 2017-02-21 DIAGNOSIS — Z01810 Encounter for preprocedural cardiovascular examination: Secondary | ICD-10-CM | POA: Diagnosis not present

## 2017-02-27 DIAGNOSIS — K8010 Calculus of gallbladder with chronic cholecystitis without obstruction: Secondary | ICD-10-CM | POA: Diagnosis not present

## 2017-02-27 DIAGNOSIS — K8020 Calculus of gallbladder without cholecystitis without obstruction: Secondary | ICD-10-CM | POA: Diagnosis not present

## 2017-02-28 DIAGNOSIS — K8020 Calculus of gallbladder without cholecystitis without obstruction: Secondary | ICD-10-CM | POA: Diagnosis not present

## 2017-03-01 DIAGNOSIS — K6389 Other specified diseases of intestine: Secondary | ICD-10-CM | POA: Diagnosis not present

## 2017-03-01 DIAGNOSIS — R079 Chest pain, unspecified: Secondary | ICD-10-CM | POA: Diagnosis not present

## 2017-03-01 DIAGNOSIS — R1013 Epigastric pain: Secondary | ICD-10-CM | POA: Diagnosis not present

## 2017-03-02 DIAGNOSIS — R1013 Epigastric pain: Secondary | ICD-10-CM | POA: Diagnosis not present

## 2017-03-02 DIAGNOSIS — R000 Tachycardia, unspecified: Secondary | ICD-10-CM | POA: Diagnosis not present

## 2017-03-25 DIAGNOSIS — I2510 Atherosclerotic heart disease of native coronary artery without angina pectoris: Secondary | ICD-10-CM | POA: Diagnosis not present

## 2017-03-25 DIAGNOSIS — D803 Selective deficiency of immunoglobulin G [IgG] subclasses: Secondary | ICD-10-CM | POA: Diagnosis not present

## 2017-03-25 DIAGNOSIS — E039 Hypothyroidism, unspecified: Secondary | ICD-10-CM | POA: Diagnosis not present

## 2017-04-03 DIAGNOSIS — R1084 Generalized abdominal pain: Secondary | ICD-10-CM | POA: Diagnosis not present

## 2017-04-07 DIAGNOSIS — K654 Sclerosing mesenteritis: Secondary | ICD-10-CM | POA: Diagnosis not present

## 2017-04-11 DIAGNOSIS — G4733 Obstructive sleep apnea (adult) (pediatric): Secondary | ICD-10-CM | POA: Diagnosis not present

## 2017-04-11 DIAGNOSIS — Z9989 Dependence on other enabling machines and devices: Secondary | ICD-10-CM | POA: Diagnosis not present

## 2017-04-15 DIAGNOSIS — K56699 Other intestinal obstruction unspecified as to partial versus complete obstruction: Secondary | ICD-10-CM | POA: Diagnosis not present

## 2017-04-15 DIAGNOSIS — D801 Nonfamilial hypogammaglobulinemia: Secondary | ICD-10-CM | POA: Diagnosis not present

## 2017-04-15 DIAGNOSIS — K654 Sclerosing mesenteritis: Secondary | ICD-10-CM | POA: Diagnosis not present

## 2017-04-16 DIAGNOSIS — K9171 Accidental puncture and laceration of a digestive system organ or structure during a digestive system procedure: Secondary | ICD-10-CM | POA: Diagnosis not present

## 2017-04-16 DIAGNOSIS — K56609 Unspecified intestinal obstruction, unspecified as to partial versus complete obstruction: Secondary | ICD-10-CM | POA: Diagnosis not present

## 2017-04-16 DIAGNOSIS — G4733 Obstructive sleep apnea (adult) (pediatric): Secondary | ICD-10-CM | POA: Diagnosis not present

## 2017-04-16 DIAGNOSIS — K56699 Other intestinal obstruction unspecified as to partial versus complete obstruction: Secondary | ICD-10-CM | POA: Diagnosis not present

## 2017-04-16 DIAGNOSIS — K654 Sclerosing mesenteritis: Secondary | ICD-10-CM | POA: Diagnosis not present

## 2017-04-16 DIAGNOSIS — K529 Noninfective gastroenteritis and colitis, unspecified: Secondary | ICD-10-CM | POA: Diagnosis not present

## 2017-04-16 DIAGNOSIS — K633 Ulcer of intestine: Secondary | ICD-10-CM | POA: Diagnosis not present

## 2017-04-16 DIAGNOSIS — K565 Intestinal adhesions [bands], unspecified as to partial versus complete obstruction: Secondary | ICD-10-CM | POA: Diagnosis not present

## 2017-04-26 DIAGNOSIS — D509 Iron deficiency anemia, unspecified: Secondary | ICD-10-CM | POA: Diagnosis not present

## 2017-04-26 DIAGNOSIS — R1900 Intra-abdominal and pelvic swelling, mass and lump, unspecified site: Secondary | ICD-10-CM | POA: Diagnosis not present

## 2017-05-01 DIAGNOSIS — G4733 Obstructive sleep apnea (adult) (pediatric): Secondary | ICD-10-CM | POA: Diagnosis not present

## 2017-05-15 DIAGNOSIS — G4733 Obstructive sleep apnea (adult) (pediatric): Secondary | ICD-10-CM | POA: Diagnosis not present

## 2017-06-06 DIAGNOSIS — G4733 Obstructive sleep apnea (adult) (pediatric): Secondary | ICD-10-CM | POA: Diagnosis not present

## 2017-06-16 DIAGNOSIS — J301 Allergic rhinitis due to pollen: Secondary | ICD-10-CM | POA: Diagnosis not present

## 2017-06-16 DIAGNOSIS — K219 Gastro-esophageal reflux disease without esophagitis: Secondary | ICD-10-CM | POA: Diagnosis not present

## 2017-06-16 DIAGNOSIS — K654 Sclerosing mesenteritis: Secondary | ICD-10-CM | POA: Diagnosis not present

## 2017-06-16 DIAGNOSIS — Z23 Encounter for immunization: Secondary | ICD-10-CM | POA: Diagnosis not present

## 2017-08-06 DIAGNOSIS — R21 Rash and other nonspecific skin eruption: Secondary | ICD-10-CM | POA: Diagnosis not present

## 2017-08-06 DIAGNOSIS — H1010 Acute atopic conjunctivitis, unspecified eye: Secondary | ICD-10-CM | POA: Diagnosis not present

## 2017-08-11 DIAGNOSIS — D803 Selective deficiency of immunoglobulin G [IgG] subclasses: Secondary | ICD-10-CM | POA: Diagnosis not present

## 2017-08-11 DIAGNOSIS — K654 Sclerosing mesenteritis: Secondary | ICD-10-CM | POA: Diagnosis not present

## 2017-08-18 DIAGNOSIS — Z23 Encounter for immunization: Secondary | ICD-10-CM | POA: Diagnosis not present

## 2017-09-15 DIAGNOSIS — D3131 Benign neoplasm of right choroid: Secondary | ICD-10-CM | POA: Diagnosis not present

## 2017-09-23 DIAGNOSIS — E291 Testicular hypofunction: Secondary | ICD-10-CM | POA: Diagnosis not present

## 2017-09-23 DIAGNOSIS — E039 Hypothyroidism, unspecified: Secondary | ICD-10-CM | POA: Diagnosis not present

## 2017-09-23 DIAGNOSIS — Z125 Encounter for screening for malignant neoplasm of prostate: Secondary | ICD-10-CM | POA: Diagnosis not present

## 2018-04-02 DIAGNOSIS — Z1321 Encounter for screening for nutritional disorder: Secondary | ICD-10-CM | POA: Diagnosis not present

## 2018-04-02 DIAGNOSIS — E291 Testicular hypofunction: Secondary | ICD-10-CM | POA: Diagnosis not present

## 2018-05-14 DIAGNOSIS — G4730 Sleep apnea, unspecified: Secondary | ICD-10-CM | POA: Diagnosis not present

## 2018-05-28 DIAGNOSIS — M1990 Unspecified osteoarthritis, unspecified site: Secondary | ICD-10-CM | POA: Diagnosis not present

## 2018-06-19 DIAGNOSIS — R1900 Intra-abdominal and pelvic swelling, mass and lump, unspecified site: Secondary | ICD-10-CM | POA: Diagnosis not present

## 2018-06-19 DIAGNOSIS — K56600 Partial intestinal obstruction, unspecified as to cause: Secondary | ICD-10-CM | POA: Diagnosis not present

## 2018-06-19 DIAGNOSIS — K56699 Other intestinal obstruction unspecified as to partial versus complete obstruction: Secondary | ICD-10-CM | POA: Diagnosis not present

## 2018-06-19 DIAGNOSIS — D803 Selective deficiency of immunoglobulin G [IgG] subclasses: Secondary | ICD-10-CM | POA: Diagnosis not present

## 2018-07-13 DIAGNOSIS — Z08 Encounter for follow-up examination after completed treatment for malignant neoplasm: Secondary | ICD-10-CM | POA: Diagnosis not present

## 2018-07-13 DIAGNOSIS — Z85820 Personal history of malignant melanoma of skin: Secondary | ICD-10-CM | POA: Diagnosis not present

## 2018-07-13 DIAGNOSIS — L299 Pruritus, unspecified: Secondary | ICD-10-CM | POA: Diagnosis not present

## 2018-08-04 DIAGNOSIS — E039 Hypothyroidism, unspecified: Secondary | ICD-10-CM | POA: Diagnosis not present

## 2018-08-04 DIAGNOSIS — Z0189 Encounter for other specified special examinations: Secondary | ICD-10-CM | POA: Diagnosis not present

## 2018-08-26 DIAGNOSIS — Z23 Encounter for immunization: Secondary | ICD-10-CM | POA: Diagnosis not present

## 2018-09-14 DIAGNOSIS — D803 Selective deficiency of immunoglobulin G [IgG] subclasses: Secondary | ICD-10-CM | POA: Diagnosis not present

## 2018-09-14 DIAGNOSIS — K654 Sclerosing mesenteritis: Secondary | ICD-10-CM | POA: Diagnosis not present

## 2018-09-14 DIAGNOSIS — R760 Raised antibody titer: Secondary | ICD-10-CM | POA: Diagnosis not present

## 2020-10-10 DIAGNOSIS — R195 Other fecal abnormalities: Secondary | ICD-10-CM | POA: Diagnosis not present

## 2020-10-10 DIAGNOSIS — R109 Unspecified abdominal pain: Secondary | ICD-10-CM | POA: Diagnosis not present

## 2020-12-21 DIAGNOSIS — M50321 Other cervical disc degeneration at C4-C5 level: Secondary | ICD-10-CM | POA: Diagnosis not present

## 2020-12-21 DIAGNOSIS — E039 Hypothyroidism, unspecified: Secondary | ICD-10-CM | POA: Diagnosis not present

## 2020-12-21 DIAGNOSIS — M4312 Spondylolisthesis, cervical region: Secondary | ICD-10-CM | POA: Diagnosis not present

## 2020-12-21 DIAGNOSIS — M79671 Pain in right foot: Secondary | ICD-10-CM | POA: Diagnosis not present

## 2020-12-21 DIAGNOSIS — K219 Gastro-esophageal reflux disease without esophagitis: Secondary | ICD-10-CM | POA: Diagnosis not present

## 2020-12-21 DIAGNOSIS — Z79899 Other long term (current) drug therapy: Secondary | ICD-10-CM | POA: Diagnosis not present

## 2020-12-21 DIAGNOSIS — F4310 Post-traumatic stress disorder, unspecified: Secondary | ICD-10-CM | POA: Diagnosis not present

## 2020-12-21 DIAGNOSIS — H6093 Unspecified otitis externa, bilateral: Secondary | ICD-10-CM | POA: Diagnosis not present

## 2020-12-21 DIAGNOSIS — E291 Testicular hypofunction: Secondary | ICD-10-CM | POA: Diagnosis not present

## 2020-12-21 DIAGNOSIS — M79672 Pain in left foot: Secondary | ICD-10-CM | POA: Diagnosis not present

## 2020-12-21 DIAGNOSIS — Z981 Arthrodesis status: Secondary | ICD-10-CM | POA: Diagnosis not present

## 2021-01-09 DIAGNOSIS — Z7189 Other specified counseling: Secondary | ICD-10-CM | POA: Diagnosis not present

## 2021-01-09 DIAGNOSIS — G4733 Obstructive sleep apnea (adult) (pediatric): Secondary | ICD-10-CM | POA: Diagnosis not present

## 2021-01-23 DIAGNOSIS — G8929 Other chronic pain: Secondary | ICD-10-CM | POA: Diagnosis not present

## 2021-01-23 DIAGNOSIS — M533 Sacrococcygeal disorders, not elsewhere classified: Secondary | ICD-10-CM | POA: Diagnosis not present

## 2021-01-25 DIAGNOSIS — G8929 Other chronic pain: Secondary | ICD-10-CM | POA: Diagnosis not present

## 2021-01-25 DIAGNOSIS — M7918 Myalgia, other site: Secondary | ICD-10-CM | POA: Diagnosis not present

## 2021-01-25 DIAGNOSIS — M533 Sacrococcygeal disorders, not elsewhere classified: Secondary | ICD-10-CM | POA: Diagnosis not present

## 2021-02-15 DIAGNOSIS — K219 Gastro-esophageal reflux disease without esophagitis: Secondary | ICD-10-CM | POA: Diagnosis not present

## 2021-02-15 DIAGNOSIS — R197 Diarrhea, unspecified: Secondary | ICD-10-CM | POA: Diagnosis not present

## 2021-02-15 DIAGNOSIS — R12 Heartburn: Secondary | ICD-10-CM | POA: Diagnosis not present

## 2021-02-15 DIAGNOSIS — R140 Abdominal distension (gaseous): Secondary | ICD-10-CM | POA: Diagnosis not present

## 2021-02-26 DIAGNOSIS — L600 Ingrowing nail: Secondary | ICD-10-CM | POA: Diagnosis not present

## 2021-03-01 DIAGNOSIS — M79671 Pain in right foot: Secondary | ICD-10-CM | POA: Diagnosis not present

## 2021-03-01 DIAGNOSIS — M533 Sacrococcygeal disorders, not elsewhere classified: Secondary | ICD-10-CM | POA: Diagnosis not present

## 2021-04-09 DIAGNOSIS — M533 Sacrococcygeal disorders, not elsewhere classified: Secondary | ICD-10-CM | POA: Diagnosis not present

## 2021-05-02 DIAGNOSIS — B351 Tinea unguium: Secondary | ICD-10-CM | POA: Diagnosis not present

## 2021-05-02 DIAGNOSIS — L600 Ingrowing nail: Secondary | ICD-10-CM | POA: Diagnosis not present

## 2021-05-23 DIAGNOSIS — Z4889 Encounter for other specified surgical aftercare: Secondary | ICD-10-CM | POA: Diagnosis not present

## 2021-05-24 DIAGNOSIS — E291 Testicular hypofunction: Secondary | ICD-10-CM | POA: Diagnosis not present

## 2021-05-24 DIAGNOSIS — E039 Hypothyroidism, unspecified: Secondary | ICD-10-CM | POA: Diagnosis not present

## 2021-05-24 DIAGNOSIS — K219 Gastro-esophageal reflux disease without esophagitis: Secondary | ICD-10-CM | POA: Diagnosis not present

## 2021-05-24 DIAGNOSIS — F4312 Post-traumatic stress disorder, chronic: Secondary | ICD-10-CM | POA: Diagnosis not present

## 2021-05-24 DIAGNOSIS — F4310 Post-traumatic stress disorder, unspecified: Secondary | ICD-10-CM | POA: Diagnosis not present

## 2021-05-24 DIAGNOSIS — H6093 Unspecified otitis externa, bilateral: Secondary | ICD-10-CM | POA: Diagnosis not present

## 2021-05-24 DIAGNOSIS — F339 Major depressive disorder, recurrent, unspecified: Secondary | ICD-10-CM | POA: Diagnosis not present

## 2021-05-24 DIAGNOSIS — F4001 Agoraphobia with panic disorder: Secondary | ICD-10-CM | POA: Diagnosis not present

## 2021-05-24 DIAGNOSIS — F411 Generalized anxiety disorder: Secondary | ICD-10-CM | POA: Diagnosis not present

## 2021-07-12 DIAGNOSIS — M7918 Myalgia, other site: Secondary | ICD-10-CM | POA: Diagnosis not present

## 2021-07-12 DIAGNOSIS — G8929 Other chronic pain: Secondary | ICD-10-CM | POA: Diagnosis not present

## 2021-07-12 DIAGNOSIS — M533 Sacrococcygeal disorders, not elsewhere classified: Secondary | ICD-10-CM | POA: Diagnosis not present

## 2021-07-16 DIAGNOSIS — L578 Other skin changes due to chronic exposure to nonionizing radiation: Secondary | ICD-10-CM | POA: Diagnosis not present

## 2021-07-16 DIAGNOSIS — Z08 Encounter for follow-up examination after completed treatment for malignant neoplasm: Secondary | ICD-10-CM | POA: Diagnosis not present

## 2021-07-16 DIAGNOSIS — D229 Melanocytic nevi, unspecified: Secondary | ICD-10-CM | POA: Diagnosis not present

## 2021-07-16 DIAGNOSIS — Z85820 Personal history of malignant melanoma of skin: Secondary | ICD-10-CM | POA: Diagnosis not present

## 2021-07-19 DIAGNOSIS — Z79899 Other long term (current) drug therapy: Secondary | ICD-10-CM | POA: Diagnosis not present

## 2021-07-19 DIAGNOSIS — M792 Neuralgia and neuritis, unspecified: Secondary | ICD-10-CM | POA: Diagnosis not present

## 2021-08-27 DIAGNOSIS — M533 Sacrococcygeal disorders, not elsewhere classified: Secondary | ICD-10-CM | POA: Diagnosis not present

## 2021-09-04 DIAGNOSIS — L814 Other melanin hyperpigmentation: Secondary | ICD-10-CM | POA: Diagnosis not present

## 2021-09-04 DIAGNOSIS — L578 Other skin changes due to chronic exposure to nonionizing radiation: Secondary | ICD-10-CM | POA: Diagnosis not present

## 2021-09-04 DIAGNOSIS — Z85820 Personal history of malignant melanoma of skin: Secondary | ICD-10-CM | POA: Diagnosis not present

## 2021-09-04 DIAGNOSIS — L570 Actinic keratosis: Secondary | ICD-10-CM | POA: Diagnosis not present

## 2021-09-04 DIAGNOSIS — L573 Poikiloderma of Civatte: Secondary | ICD-10-CM | POA: Diagnosis not present

## 2021-09-28 DIAGNOSIS — R300 Dysuria: Secondary | ICD-10-CM | POA: Diagnosis not present

## 2021-10-03 DIAGNOSIS — R300 Dysuria: Secondary | ICD-10-CM | POA: Diagnosis not present

## 2021-10-23 DIAGNOSIS — R300 Dysuria: Secondary | ICD-10-CM | POA: Diagnosis not present

## 2021-10-23 DIAGNOSIS — E291 Testicular hypofunction: Secondary | ICD-10-CM | POA: Diagnosis not present

## 2021-10-23 DIAGNOSIS — E785 Hyperlipidemia, unspecified: Secondary | ICD-10-CM | POA: Diagnosis not present

## 2021-10-23 DIAGNOSIS — N419 Inflammatory disease of prostate, unspecified: Secondary | ICD-10-CM | POA: Diagnosis not present

## 2021-10-23 DIAGNOSIS — Z0189 Encounter for other specified special examinations: Secondary | ICD-10-CM | POA: Diagnosis not present

## 2021-10-23 DIAGNOSIS — I10 Essential (primary) hypertension: Secondary | ICD-10-CM | POA: Diagnosis not present

## 2021-12-20 DIAGNOSIS — M533 Sacrococcygeal disorders, not elsewhere classified: Secondary | ICD-10-CM | POA: Diagnosis not present

## 2022-01-17 DIAGNOSIS — K654 Sclerosing mesenteritis: Secondary | ICD-10-CM | POA: Diagnosis not present

## 2022-01-17 DIAGNOSIS — Z9049 Acquired absence of other specified parts of digestive tract: Secondary | ICD-10-CM | POA: Diagnosis not present

## 2022-01-17 DIAGNOSIS — Z800 Family history of malignant neoplasm of digestive organs: Secondary | ICD-10-CM | POA: Diagnosis not present

## 2022-01-21 DIAGNOSIS — M533 Sacrococcygeal disorders, not elsewhere classified: Secondary | ICD-10-CM | POA: Diagnosis not present

## 2022-01-30 DIAGNOSIS — H66002 Acute suppurative otitis media without spontaneous rupture of ear drum, left ear: Secondary | ICD-10-CM | POA: Diagnosis not present

## 2022-03-14 DIAGNOSIS — M533 Sacrococcygeal disorders, not elsewhere classified: Secondary | ICD-10-CM | POA: Diagnosis not present

## 2022-04-03 DIAGNOSIS — G4733 Obstructive sleep apnea (adult) (pediatric): Secondary | ICD-10-CM | POA: Diagnosis not present

## 2022-04-05 DIAGNOSIS — R300 Dysuria: Secondary | ICD-10-CM | POA: Diagnosis not present

## 2022-04-05 DIAGNOSIS — E291 Testicular hypofunction: Secondary | ICD-10-CM | POA: Diagnosis not present

## 2022-04-18 DIAGNOSIS — Z8371 Family history of colonic polyps: Secondary | ICD-10-CM | POA: Diagnosis not present

## 2022-04-18 DIAGNOSIS — Z800 Family history of malignant neoplasm of digestive organs: Secondary | ICD-10-CM | POA: Diagnosis not present

## 2022-04-18 DIAGNOSIS — R1084 Generalized abdominal pain: Secondary | ICD-10-CM | POA: Diagnosis not present

## 2022-04-18 DIAGNOSIS — K654 Sclerosing mesenteritis: Secondary | ICD-10-CM | POA: Diagnosis not present

## 2022-04-23 DIAGNOSIS — M533 Sacrococcygeal disorders, not elsewhere classified: Secondary | ICD-10-CM | POA: Diagnosis not present

## 2022-05-01 DIAGNOSIS — H579 Unspecified disorder of eye and adnexa: Secondary | ICD-10-CM | POA: Diagnosis not present

## 2022-05-01 DIAGNOSIS — J310 Chronic rhinitis: Secondary | ICD-10-CM | POA: Diagnosis not present

## 2022-05-01 DIAGNOSIS — D801 Nonfamilial hypogammaglobulinemia: Secondary | ICD-10-CM | POA: Diagnosis not present

## 2022-05-01 DIAGNOSIS — K654 Sclerosing mesenteritis: Secondary | ICD-10-CM | POA: Diagnosis not present

## 2022-05-21 DIAGNOSIS — Z87440 Personal history of urinary (tract) infections: Secondary | ICD-10-CM | POA: Diagnosis not present

## 2022-05-21 DIAGNOSIS — N4889 Other specified disorders of penis: Secondary | ICD-10-CM | POA: Diagnosis not present

## 2022-05-21 DIAGNOSIS — N489 Disorder of penis, unspecified: Secondary | ICD-10-CM | POA: Diagnosis not present

## 2022-05-30 DIAGNOSIS — E291 Testicular hypofunction: Secondary | ICD-10-CM | POA: Diagnosis not present

## 2022-05-31 DIAGNOSIS — J310 Chronic rhinitis: Secondary | ICD-10-CM | POA: Diagnosis not present

## 2022-05-31 DIAGNOSIS — K219 Gastro-esophageal reflux disease without esophagitis: Secondary | ICD-10-CM | POA: Diagnosis not present

## 2022-05-31 DIAGNOSIS — H5789 Other specified disorders of eye and adnexa: Secondary | ICD-10-CM | POA: Diagnosis not present

## 2022-05-31 DIAGNOSIS — D801 Nonfamilial hypogammaglobulinemia: Secondary | ICD-10-CM | POA: Diagnosis not present

## 2022-07-08 DIAGNOSIS — U071 COVID-19: Secondary | ICD-10-CM | POA: Diagnosis not present

## 2022-07-25 DIAGNOSIS — J019 Acute sinusitis, unspecified: Secondary | ICD-10-CM | POA: Diagnosis not present

## 2022-09-04 DIAGNOSIS — L573 Poikiloderma of Civatte: Secondary | ICD-10-CM | POA: Diagnosis not present

## 2022-09-04 DIAGNOSIS — L814 Other melanin hyperpigmentation: Secondary | ICD-10-CM | POA: Diagnosis not present

## 2022-09-04 DIAGNOSIS — L578 Other skin changes due to chronic exposure to nonionizing radiation: Secondary | ICD-10-CM | POA: Diagnosis not present

## 2022-09-04 DIAGNOSIS — L821 Other seborrheic keratosis: Secondary | ICD-10-CM | POA: Diagnosis not present

## 2022-09-04 DIAGNOSIS — L570 Actinic keratosis: Secondary | ICD-10-CM | POA: Diagnosis not present

## 2022-10-03 DIAGNOSIS — M533 Sacrococcygeal disorders, not elsewhere classified: Secondary | ICD-10-CM | POA: Diagnosis not present

## 2022-10-17 DIAGNOSIS — E039 Hypothyroidism, unspecified: Secondary | ICD-10-CM | POA: Diagnosis not present

## 2022-10-17 DIAGNOSIS — E291 Testicular hypofunction: Secondary | ICD-10-CM | POA: Diagnosis not present

## 2022-10-17 DIAGNOSIS — D649 Anemia, unspecified: Secondary | ICD-10-CM | POA: Diagnosis not present

## 2022-10-17 DIAGNOSIS — K219 Gastro-esophageal reflux disease without esophagitis: Secondary | ICD-10-CM | POA: Diagnosis not present

## 2022-10-17 DIAGNOSIS — E785 Hyperlipidemia, unspecified: Secondary | ICD-10-CM | POA: Diagnosis not present

## 2022-10-30 DIAGNOSIS — Z125 Encounter for screening for malignant neoplasm of prostate: Secondary | ICD-10-CM | POA: Diagnosis not present

## 2022-11-01 DIAGNOSIS — H9203 Otalgia, bilateral: Secondary | ICD-10-CM | POA: Diagnosis not present

## 2022-11-01 DIAGNOSIS — H6063 Unspecified chronic otitis externa, bilateral: Secondary | ICD-10-CM | POA: Diagnosis not present

## 2022-11-05 DIAGNOSIS — Z23 Encounter for immunization: Secondary | ICD-10-CM | POA: Diagnosis not present

## 2022-11-26 DIAGNOSIS — Z0189 Encounter for other specified special examinations: Secondary | ICD-10-CM | POA: Diagnosis not present

## 2022-11-26 DIAGNOSIS — E079 Disorder of thyroid, unspecified: Secondary | ICD-10-CM | POA: Diagnosis not present

## 2022-11-29 DIAGNOSIS — H579 Unspecified disorder of eye and adnexa: Secondary | ICD-10-CM | POA: Diagnosis not present

## 2022-11-29 DIAGNOSIS — K654 Sclerosing mesenteritis: Secondary | ICD-10-CM | POA: Diagnosis not present

## 2022-11-29 DIAGNOSIS — J310 Chronic rhinitis: Secondary | ICD-10-CM | POA: Diagnosis not present

## 2022-11-29 DIAGNOSIS — D801 Nonfamilial hypogammaglobulinemia: Secondary | ICD-10-CM | POA: Diagnosis not present

## 2022-12-23 DIAGNOSIS — L905 Scar conditions and fibrosis of skin: Secondary | ICD-10-CM | POA: Diagnosis not present

## 2022-12-23 DIAGNOSIS — Z85820 Personal history of malignant melanoma of skin: Secondary | ICD-10-CM | POA: Diagnosis not present

## 2022-12-23 DIAGNOSIS — H903 Sensorineural hearing loss, bilateral: Secondary | ICD-10-CM | POA: Diagnosis not present

## 2022-12-23 DIAGNOSIS — L739 Follicular disorder, unspecified: Secondary | ICD-10-CM | POA: Diagnosis not present

## 2022-12-23 DIAGNOSIS — L578 Other skin changes due to chronic exposure to nonionizing radiation: Secondary | ICD-10-CM | POA: Diagnosis not present

## 2022-12-23 DIAGNOSIS — H9312 Tinnitus, left ear: Secondary | ICD-10-CM | POA: Diagnosis not present

## 2023-02-11 DIAGNOSIS — Z461 Encounter for fitting and adjustment of hearing aid: Secondary | ICD-10-CM | POA: Diagnosis not present

## 2023-02-11 DIAGNOSIS — H905 Unspecified sensorineural hearing loss: Secondary | ICD-10-CM | POA: Diagnosis not present

## 2023-02-11 DIAGNOSIS — H9319 Tinnitus, unspecified ear: Secondary | ICD-10-CM | POA: Diagnosis not present

## 2023-03-04 DIAGNOSIS — E039 Hypothyroidism, unspecified: Secondary | ICD-10-CM | POA: Diagnosis not present

## 2023-03-04 DIAGNOSIS — E041 Nontoxic single thyroid nodule: Secondary | ICD-10-CM | POA: Diagnosis not present

## 2023-03-04 DIAGNOSIS — R222 Localized swelling, mass and lump, trunk: Secondary | ICD-10-CM | POA: Diagnosis not present

## 2023-03-04 DIAGNOSIS — D649 Anemia, unspecified: Secondary | ICD-10-CM | POA: Diagnosis not present

## 2023-03-17 DIAGNOSIS — R222 Localized swelling, mass and lump, trunk: Secondary | ICD-10-CM | POA: Diagnosis not present

## 2023-04-10 DIAGNOSIS — K5909 Other constipation: Secondary | ICD-10-CM | POA: Diagnosis not present

## 2023-04-10 DIAGNOSIS — K219 Gastro-esophageal reflux disease without esophagitis: Secondary | ICD-10-CM | POA: Diagnosis not present

## 2023-04-10 DIAGNOSIS — K654 Sclerosing mesenteritis: Secondary | ICD-10-CM | POA: Diagnosis not present

## 2023-04-10 DIAGNOSIS — Z800 Family history of malignant neoplasm of digestive organs: Secondary | ICD-10-CM | POA: Diagnosis not present

## 2023-04-18 DIAGNOSIS — Z461 Encounter for fitting and adjustment of hearing aid: Secondary | ICD-10-CM | POA: Diagnosis not present

## 2023-06-23 DIAGNOSIS — H906 Mixed conductive and sensorineural hearing loss, bilateral: Secondary | ICD-10-CM | POA: Diagnosis not present

## 2023-07-16 DIAGNOSIS — H579 Unspecified disorder of eye and adnexa: Secondary | ICD-10-CM | POA: Diagnosis not present

## 2023-07-16 DIAGNOSIS — J310 Chronic rhinitis: Secondary | ICD-10-CM | POA: Diagnosis not present

## 2023-07-16 DIAGNOSIS — D801 Nonfamilial hypogammaglobulinemia: Secondary | ICD-10-CM | POA: Diagnosis not present

## 2023-07-16 DIAGNOSIS — Z23 Encounter for immunization: Secondary | ICD-10-CM | POA: Diagnosis not present

## 2023-07-16 DIAGNOSIS — K219 Gastro-esophageal reflux disease without esophagitis: Secondary | ICD-10-CM | POA: Diagnosis not present

## 2023-07-24 DIAGNOSIS — D801 Nonfamilial hypogammaglobulinemia: Secondary | ICD-10-CM | POA: Diagnosis not present

## 2023-07-29 DIAGNOSIS — H906 Mixed conductive and sensorineural hearing loss, bilateral: Secondary | ICD-10-CM | POA: Diagnosis not present

## 2023-07-29 DIAGNOSIS — Z461 Encounter for fitting and adjustment of hearing aid: Secondary | ICD-10-CM | POA: Diagnosis not present

## 2023-07-30 DIAGNOSIS — Z0101 Encounter for examination of eyes and vision with abnormal findings: Secondary | ICD-10-CM | POA: Diagnosis not present

## 2023-07-30 DIAGNOSIS — H1013 Acute atopic conjunctivitis, bilateral: Secondary | ICD-10-CM | POA: Diagnosis not present

## 2023-07-30 DIAGNOSIS — Z0100 Encounter for examination of eyes and vision without abnormal findings: Secondary | ICD-10-CM | POA: Diagnosis not present

## 2023-07-30 DIAGNOSIS — H5213 Myopia, bilateral: Secondary | ICD-10-CM | POA: Diagnosis not present

## 2023-07-30 DIAGNOSIS — H04123 Dry eye syndrome of bilateral lacrimal glands: Secondary | ICD-10-CM | POA: Diagnosis not present

## 2023-09-10 DIAGNOSIS — L570 Actinic keratosis: Secondary | ICD-10-CM | POA: Diagnosis not present

## 2023-09-10 DIAGNOSIS — H53149 Visual discomfort, unspecified: Secondary | ICD-10-CM | POA: Diagnosis not present

## 2023-09-10 DIAGNOSIS — Z85820 Personal history of malignant melanoma of skin: Secondary | ICD-10-CM | POA: Diagnosis not present

## 2023-09-10 DIAGNOSIS — L814 Other melanin hyperpigmentation: Secondary | ICD-10-CM | POA: Diagnosis not present

## 2023-09-10 DIAGNOSIS — L573 Poikiloderma of Civatte: Secondary | ICD-10-CM | POA: Diagnosis not present

## 2023-09-10 DIAGNOSIS — H5213 Myopia, bilateral: Secondary | ICD-10-CM | POA: Diagnosis not present

## 2023-09-10 DIAGNOSIS — L578 Other skin changes due to chronic exposure to nonionizing radiation: Secondary | ICD-10-CM | POA: Diagnosis not present

## 2023-09-11 DIAGNOSIS — H53149 Visual discomfort, unspecified: Secondary | ICD-10-CM | POA: Diagnosis not present

## 2023-09-11 DIAGNOSIS — H5213 Myopia, bilateral: Secondary | ICD-10-CM | POA: Diagnosis not present

## 2023-09-15 DIAGNOSIS — E785 Hyperlipidemia, unspecified: Secondary | ICD-10-CM | POA: Diagnosis not present

## 2023-09-15 DIAGNOSIS — K219 Gastro-esophageal reflux disease without esophagitis: Secondary | ICD-10-CM | POA: Diagnosis not present

## 2023-09-15 DIAGNOSIS — E039 Hypothyroidism, unspecified: Secondary | ICD-10-CM | POA: Diagnosis not present

## 2023-09-15 DIAGNOSIS — Z23 Encounter for immunization: Secondary | ICD-10-CM | POA: Diagnosis not present

## 2023-09-15 DIAGNOSIS — N401 Enlarged prostate with lower urinary tract symptoms: Secondary | ICD-10-CM | POA: Diagnosis not present

## 2023-09-15 DIAGNOSIS — E041 Nontoxic single thyroid nodule: Secondary | ICD-10-CM | POA: Diagnosis not present

## 2023-09-15 DIAGNOSIS — F4310 Post-traumatic stress disorder, unspecified: Secondary | ICD-10-CM | POA: Diagnosis not present

## 2023-09-15 DIAGNOSIS — M545 Low back pain, unspecified: Secondary | ICD-10-CM | POA: Diagnosis not present

## 2024-01-07 DIAGNOSIS — G4739 Other sleep apnea: Secondary | ICD-10-CM | POA: Diagnosis not present

## 2024-02-13 DIAGNOSIS — J320 Chronic maxillary sinusitis: Secondary | ICD-10-CM | POA: Diagnosis not present

## 2024-02-13 DIAGNOSIS — J322 Chronic ethmoidal sinusitis: Secondary | ICD-10-CM | POA: Diagnosis not present

## 2025-01-26 ENCOUNTER — Ambulatory Visit: Admitting: Family Medicine
